# Patient Record
Sex: Female | Born: 1985 | Race: White | Hispanic: No | Marital: Single | Smoking: Never smoker
Health system: Southern US, Community
[De-identification: ages and names within clinical notes are randomized; demographics above are authoritative.]

## PROBLEM LIST (undated history)

## (undated) DIAGNOSIS — R569 Unspecified convulsions: Secondary | ICD-10-CM

## (undated) DIAGNOSIS — E119 Type 2 diabetes mellitus without complications: Secondary | ICD-10-CM

## (undated) HISTORY — PX: OTHER SURGICAL HISTORY: SHX169

## (undated) HISTORY — PX: WISDOM TOOTH EXTRACTION: SHX21

---

## 2014-04-23 ENCOUNTER — Encounter: Payer: Self-pay | Admitting: Obstetrics and Gynecology

## 2014-04-30 ENCOUNTER — Encounter: Payer: Self-pay | Admitting: Maternal & Fetal Medicine

## 2014-05-08 ENCOUNTER — Encounter
Admit: 2014-05-08 | Disposition: A | Discharge status: Home / Self Care | Payer: Self-pay | Admitting: Obstetrics & Gynecology

## 2014-05-14 ENCOUNTER — Encounter: Payer: Self-pay | Admitting: Obstetrics and Gynecology

## 2016-06-29 ENCOUNTER — Encounter: Payer: Self-pay | Admitting: *Deleted

## 2016-06-29 ENCOUNTER — Ambulatory Visit
Admission: RE | Admit: 2016-06-29 | Discharge: 2016-06-29 | Disposition: A | Discharge status: Home / Self Care | Payer: BLUE CROSS/BLUE SHIELD | Source: Ambulatory Visit | Attending: Obstetrics and Gynecology | Admitting: Obstetrics and Gynecology

## 2016-06-29 VITALS — BP 133/87 | HR 117 | Temp 98.2°F | Resp 18 | Ht 65.0 in | Wt 222.0 lb

## 2016-06-29 DIAGNOSIS — Z3A34 34 weeks gestation of pregnancy: Secondary | ICD-10-CM

## 2016-06-29 DIAGNOSIS — O24319 Unspecified pre-existing diabetes mellitus in pregnancy, unspecified trimester: Secondary | ICD-10-CM

## 2016-06-29 HISTORY — DX: Type 2 diabetes mellitus without complications: E11.9

## 2016-06-29 HISTORY — DX: Unspecified convulsions: R56.9

## 2016-06-29 NOTE — Progress Notes (Signed)
NST at 6610w5d for Diabetes: FHR 135 Mod variability 15x15 accels present No decels No significant uterine activity Reactive NST

## 2016-07-06 ENCOUNTER — Ambulatory Visit
Admission: RE | Admit: 2016-07-06 | Discharge: 2016-07-06 | Disposition: A | Discharge status: Home / Self Care | Payer: BLUE CROSS/BLUE SHIELD | Source: Ambulatory Visit | Attending: Obstetrics and Gynecology | Admitting: Obstetrics and Gynecology

## 2016-07-06 VITALS — BP 128/81 | HR 118 | Temp 97.7°F | Resp 18 | Ht 65.0 in | Wt 221.0 lb

## 2016-07-06 DIAGNOSIS — Z3A35 35 weeks gestation of pregnancy: Secondary | ICD-10-CM

## 2016-07-06 DIAGNOSIS — O24019 Pre-existing diabetes mellitus, type 1, in pregnancy, unspecified trimester: Secondary | ICD-10-CM

## 2016-07-06 DIAGNOSIS — O24013 Pre-existing diabetes mellitus, type 1, in pregnancy, third trimester: Secondary | ICD-10-CM

## 2016-07-06 NOTE — Progress Notes (Signed)
NST at 3268w5d for Diabetes: FHR 135 Mod variability 15x15 accels present No decels No significant uterine activity Reactive NST

## 2016-07-13 ENCOUNTER — Ambulatory Visit
Admission: RE | Admit: 2016-07-13 | Discharge: 2016-07-13 | Disposition: A | Discharge status: Home / Self Care | Payer: BLUE CROSS/BLUE SHIELD | Source: Ambulatory Visit | Attending: Obstetrics & Gynecology | Admitting: Obstetrics & Gynecology

## 2016-07-13 ENCOUNTER — Other Ambulatory Visit: Payer: Self-pay | Admitting: *Deleted

## 2016-07-13 ENCOUNTER — Other Ambulatory Visit: Payer: Self-pay | Admitting: Radiology

## 2016-07-13 ENCOUNTER — Other Ambulatory Visit: Payer: Self-pay | Admitting: Obstetrics & Gynecology

## 2016-07-13 DIAGNOSIS — O24414 Gestational diabetes mellitus in pregnancy, insulin controlled: Secondary | ICD-10-CM

## 2016-07-13 DIAGNOSIS — O24919 Unspecified diabetes mellitus in pregnancy, unspecified trimester: Secondary | ICD-10-CM | POA: Insufficient documentation

## 2016-07-13 DIAGNOSIS — Z3A36 36 weeks gestation of pregnancy: Secondary | ICD-10-CM | POA: Insufficient documentation

## 2016-07-13 NOTE — Progress Notes (Signed)
NST at 36 5/7 weeks for Diabetes: Baseline 125 Moderate variability Non-reactive but accelerations to 10 bpm over baseline present No decelerations Irregular contraction  BPP 8/10 (-2 for NST)  [facility] Discussed labor precautions and kick counts  Mitsy Owen, [place] A, MD

## 2017-04-12 ENCOUNTER — Encounter (HOSPITAL_COMMUNITY): Payer: Self-pay

## 2017-11-09 IMAGING — US US MFM FETAL BPP W/ NON-STRESS
1 series · 8 of 8 positions shown · non-contrast
Comparison: none

PATIENT INFO:

PERFORMED BY:
SERVICE(S) PROVIDED:
INDICATIONS:
36 weeks gestation of pregnancy
FETAL EVALUATION:
Num Of Fetuses:     1
Fetal Heart         128
Rate(bpm):
Presentation:       Vertex
Placenta:           Anterior Grade 2, No previa
AFI Sum(cm)     %Tile       Largest Pocket(cm)
9.05            16
RUQ(cm)       RLQ(cm)       LUQ(cm)        LLQ(cm)
5.31          1.47          2.27           0
BIOPHYSICAL EVALUATION:
Amniotic F.V:   Within normal limits       F. Tone:         Observed
F. Movement:    Observed                   N.S.T:           Nonreactive
F. Breathing:   Observed                   Score:           [DATE]
GESTATIONAL AGE:
LMP:           36w 5d        Date:  10/30/15                 EDD:   08/05/16
Best:          36w 5d     Det. By:  LMP  (10/30/15)          EDD:   08/05/16

[Series 1: us mfm fetal bpp w/ non-stress · 0.25mm/px · 8 acquisitions, 8 frames shown]
[im 1/8]
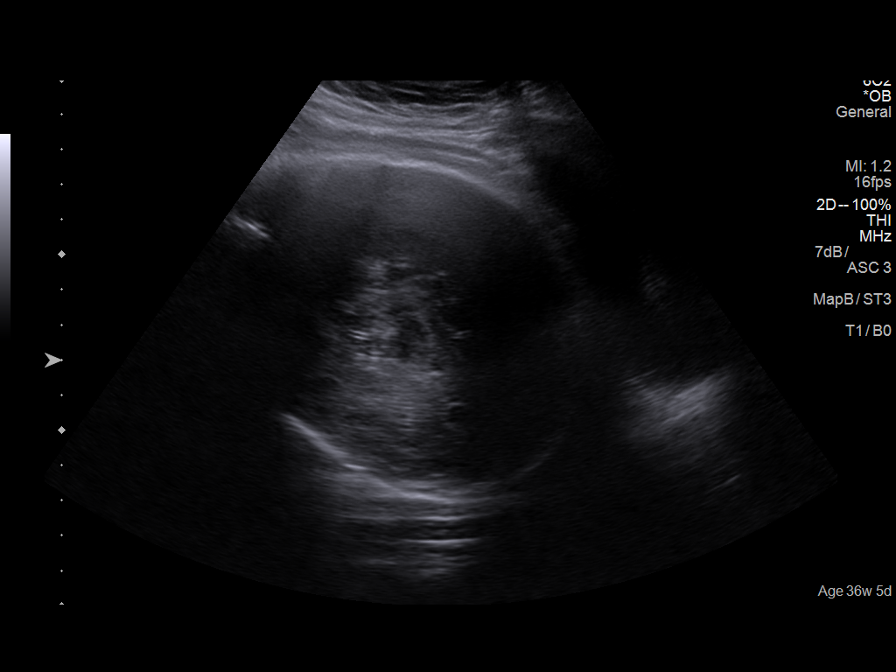
[im 2/8]
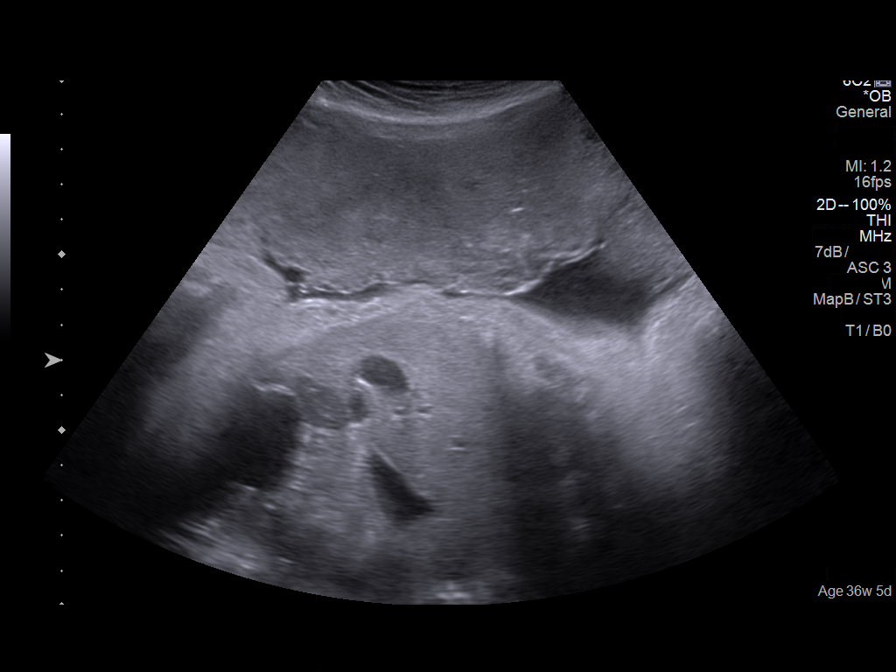
[im 3/8]
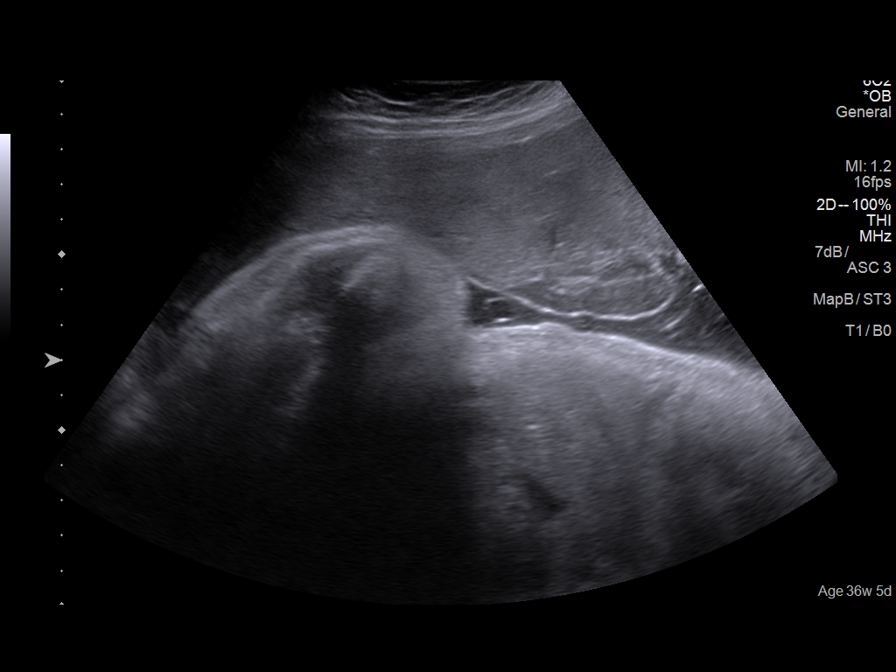
[im 4/8]
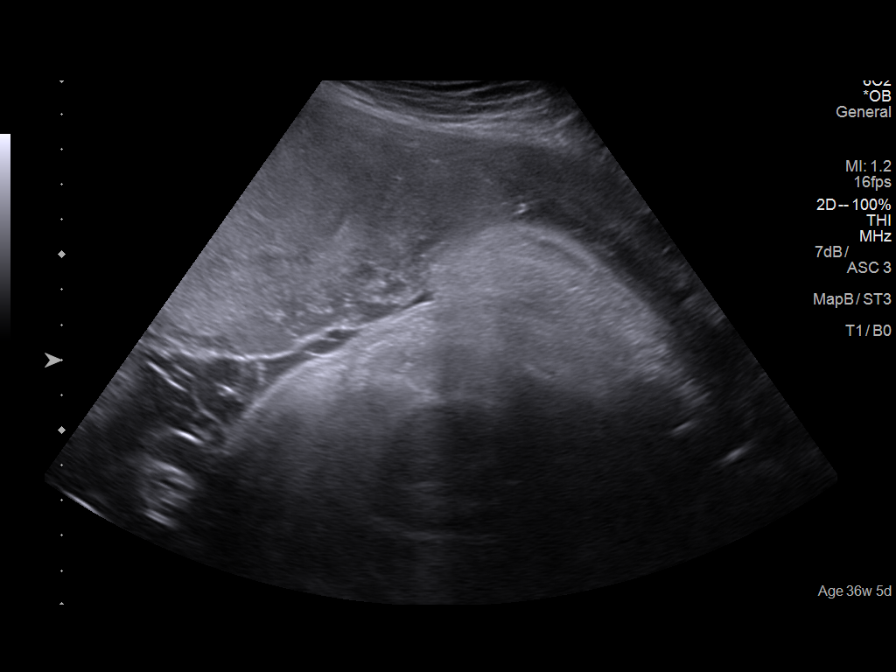
[im 5/8]
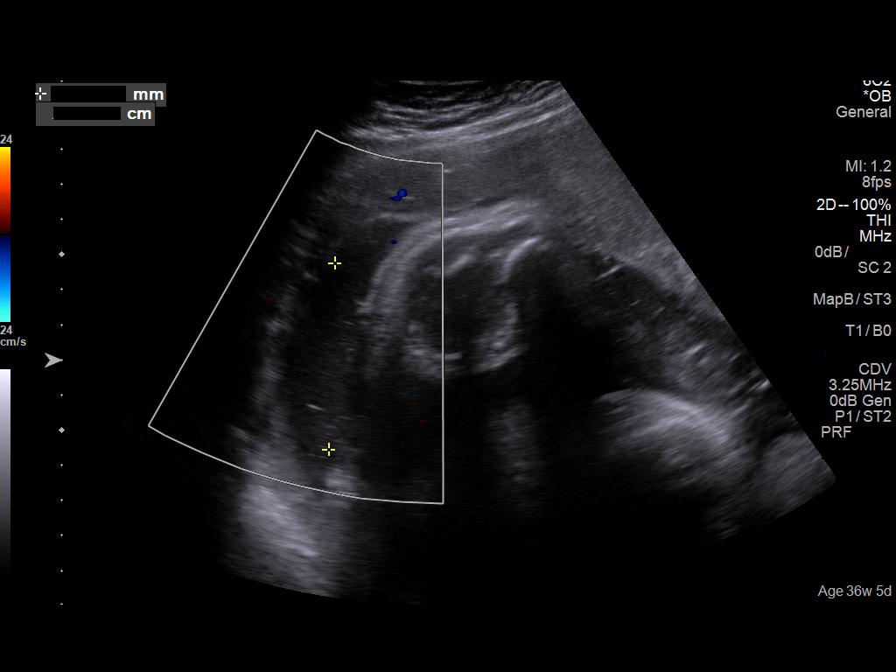
[im 6/8]
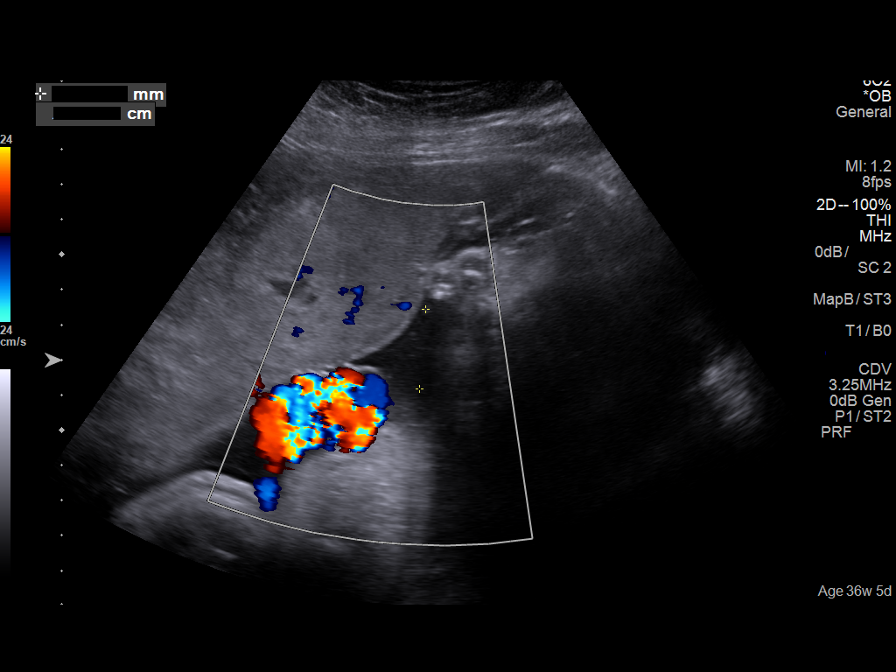
[im 7/8]
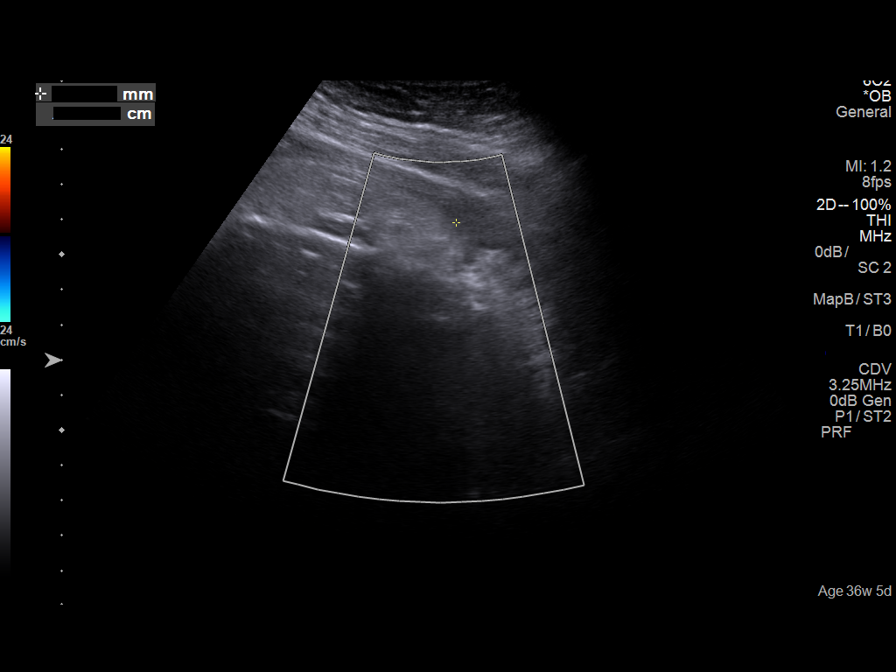
[im 8/8]
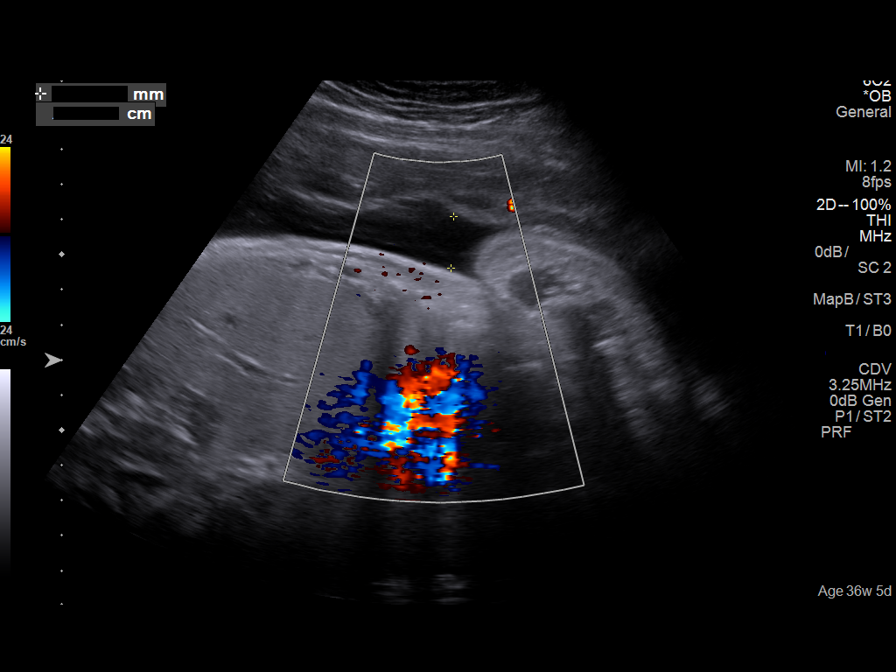

[8 of 8 positions shown; findings below may reference images not displayed]

IMPRESSION: See [REDACTED] for NST interpretation
Breon Mickelsen and labor precautions discussed
Scheduled for delivery on [REDACTED].
